# Patient Record
Sex: Female | Born: 1965 | Race: Black or African American | Hispanic: No | Marital: Single | Smoking: Never smoker
Health system: Southern US, Community
[De-identification: ages and names within clinical notes are randomized; demographics above are authoritative.]

## PROBLEM LIST (undated history)

## (undated) DIAGNOSIS — I10 Essential (primary) hypertension: Secondary | ICD-10-CM

## (undated) DIAGNOSIS — E119 Type 2 diabetes mellitus without complications: Secondary | ICD-10-CM

## (undated) HISTORY — PX: HERNIA REPAIR: SHX51

---

## 2019-01-24 ENCOUNTER — Other Ambulatory Visit: Payer: Self-pay

## 2019-01-24 ENCOUNTER — Emergency Department (HOSPITAL_BASED_OUTPATIENT_CLINIC_OR_DEPARTMENT_OTHER): Payer: BLUE CROSS/BLUE SHIELD

## 2019-01-24 ENCOUNTER — Emergency Department (HOSPITAL_BASED_OUTPATIENT_CLINIC_OR_DEPARTMENT_OTHER)
Admission: EM | Admit: 2019-01-24 | Discharge: 2019-01-25 | Disposition: A | Discharge status: Home / Self Care | Payer: BLUE CROSS/BLUE SHIELD | Attending: Emergency Medicine | Admitting: Emergency Medicine

## 2019-01-24 ENCOUNTER — Encounter (HOSPITAL_BASED_OUTPATIENT_CLINIC_OR_DEPARTMENT_OTHER): Payer: Self-pay

## 2019-01-24 DIAGNOSIS — I10 Essential (primary) hypertension: Secondary | ICD-10-CM | POA: Diagnosis not present

## 2019-01-24 DIAGNOSIS — J111 Influenza due to unidentified influenza virus with other respiratory manifestations: Secondary | ICD-10-CM | POA: Diagnosis not present

## 2019-01-24 DIAGNOSIS — E119 Type 2 diabetes mellitus without complications: Secondary | ICD-10-CM | POA: Insufficient documentation

## 2019-01-24 DIAGNOSIS — R69 Illness, unspecified: Secondary | ICD-10-CM

## 2019-01-24 DIAGNOSIS — R05 Cough: Secondary | ICD-10-CM | POA: Diagnosis present

## 2019-01-24 HISTORY — DX: Essential (primary) hypertension: I10

## 2019-01-24 HISTORY — DX: Type 2 diabetes mellitus without complications: E11.9

## 2019-01-24 NOTE — ED Provider Notes (Signed)
[facility] Provider Note: Lowella Dell, MD, FACEP  CSN: [id number] MRN: [id number] ARRIVAL: 01/24/19 at 2159 ROOM: MH03/[facility]03   CHIEF COMPLAINT  Cough   HISTORY OF PRESENT ILLNESS  01/24/19 11:55 PM Hailey Juarez is a 53 y.o. female with a 4-day history of flulike symptoms.  Specifically she has had body aches which she rates as an 8 out of 10.  She is also had a persistent cough sore throat when she coughs, diarrhea, chills and occasional nasal congestion.  She has not been vomiting.  She has been taking over-the-counter cough and cold medications without adequate relief.   Past Medical History:  Diagnosis Date  . Diabetes mellitus without complication (HCC)   . Hypertension     Past Surgical History:  Procedure Laterality Date  . CESAREAN SECTION    . HERNIA REPAIR      No family history on file.  Social History   Tobacco Use  . Smoking status: Never Smoker  . Smokeless tobacco: Never Used  Substance Use Topics  . Alcohol use: Never    Frequency: Never  . Drug use: Never    Prior to Admission medications   Not on File    Allergies Patient has no known allergies.   REVIEW OF SYSTEMS  Negative except as noted here or in the History of Present Illness.   PHYSICAL EXAMINATION  Initial Vital Signs Blood pressure (!) 147/94, pulse (!) 124, temperature 99 F (37.2 C), temperature source Oral, resp. rate 20, height 5\' 4"  (1.626 m), weight 113.9 kg, SpO2 98 %.  Examination General: Well-developed, well-nourished female in no acute distress; appearance consistent with age of record HENT: normocephalic; atraumatic; no pharyngeal erythema or exudate Eyes: pupils equal, round and reactive to light; extraocular muscles intact Neck: supple Heart: regular rate and rhythm; tachycardia Lungs: Decreased air movement bilaterally with shallow breaths Abdomen: soft; nondistended; nontender; bowel sounds present Extremities: No deformity; full range of  motion; pulses normal Neurologic: Awake, alert and oriented; motor function intact in all extremities and symmetric; no facial droop Skin: Warm and dry Psychiatric: Normal mood and affect   RESULTS  Summary of this visit's results, reviewed by myself:   EKG Interpretation  Date/Time:    Ventricular Rate:    PR Interval:    QRS Duration:   QT Interval:    QTC Calculation:   R Axis:     Text Interpretation:        Laboratory Studies: No results found for this or any previous visit (from the past 24 hour(s)). Imaging Studies: Dg Chest 2 View  Result Date: 01/24/2019 CLINICAL DATA:  Flu symptoms for 4 days.  Chest pain and cough. EXAM: CHEST - 2 VIEW COMPARISON:  None. FINDINGS: Cardiomediastinal silhouette is normal. No pleural effusions or focal consolidations. Mild bronchitic changes. Trachea deviated LEFT at the level the neck and there is no pneumothorax. Soft tissue planes and included osseous structures are non-suspicious. IMPRESSION: 1. Mild bronchitic changes without focal consolidation. 2. Tracheal deviation associated with substernal goiter. Electronically Signed   By: Awilda Metro M.D.   On: 01/24/2019 22:32    ED COURSE and MDM  Nursing notes and initial vitals signs, including pulse oximetry, reviewed.  Vitals:   01/24/19 2208  BP: (!) 147/94  Pulse: (!) 124  Resp: 20  Temp: 99 F (37.2 C)  TempSrc: Oral  SpO2: 98%  Weight: 113.9 kg  Height: 5\' 4"  (1.626 m)   Patient aware of her goiter.  We  will provide an inhaler and instruct her in its use.  She was advised to take Tylenol 650 mg every 6 hours as needed for fever or body aches.  PROCEDURES    ED DIAGNOSES     ICD-10-CM   1. Influenza-like illness R69        Reagyn Facemire, MD 01/25/19 [phone]

## 2019-01-24 NOTE — ED Triage Notes (Signed)
C/o flu like sx x 4 days-NAD-steady gait 

## 2019-01-25 MED ORDER — HYDROCOD POLST-CPM POLST ER 10-8 MG/5ML PO SUER
5.0000 mL | Freq: Once | ORAL | Status: AC
  Administered 2019-01-25: 5 mL via ORAL
  Filled 2019-01-25: qty 5

## 2019-01-25 MED ORDER — HYDROCOD POLST-CPM POLST ER 10-8 MG/5ML PO SUER: 5.0000 mL | Freq: Two times a day (BID) | ORAL | 0 refills | Status: DC | PRN

## 2019-01-25 MED ORDER — ALBUTEROL SULFATE HFA 108 (90 BASE) MCG/ACT IN AERS
2.0000 | INHALATION_SPRAY | RESPIRATORY_TRACT | Status: DC | PRN
  Administered 2019-01-25: 2 via RESPIRATORY_TRACT
  Filled 2019-01-25: qty 6.7

## 2019-01-25 MED ORDER — ACETAMINOPHEN 325 MG PO TABS
650.0000 mg | ORAL_TABLET | Freq: Once | ORAL | Status: AC
  Administered 2019-01-25: 650 mg via ORAL
  Filled 2019-01-25: qty 2

## 2020-10-17 IMAGING — DX DG CHEST 2V
2 series · 2 of 2 positions shown · non-contrast
Comparison: None.

CLINICAL DATA: Flu symptoms for 4 days.  Chest pain and cough.

EXAM:
CHEST - 2 VIEW

[chest pa]
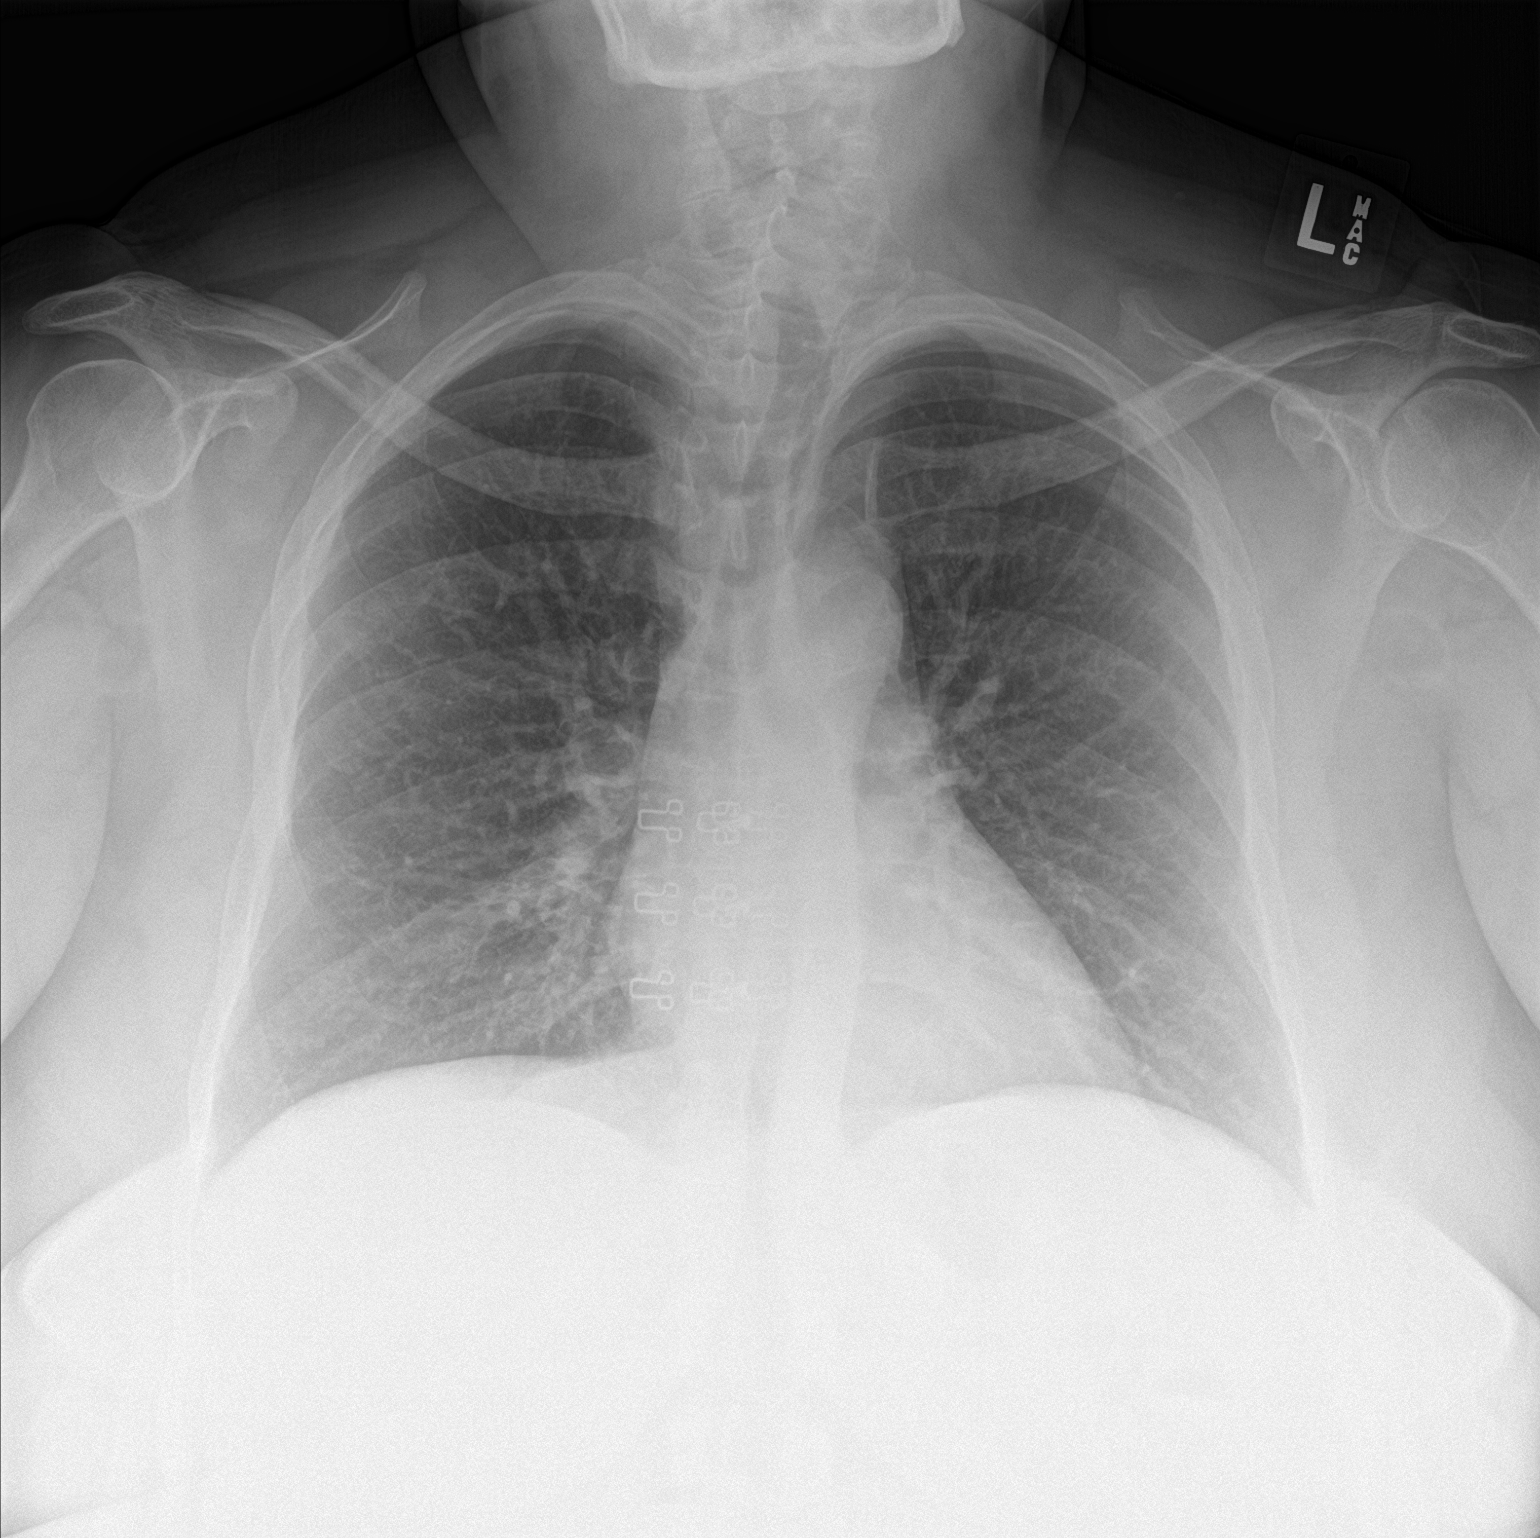

[chest lat]
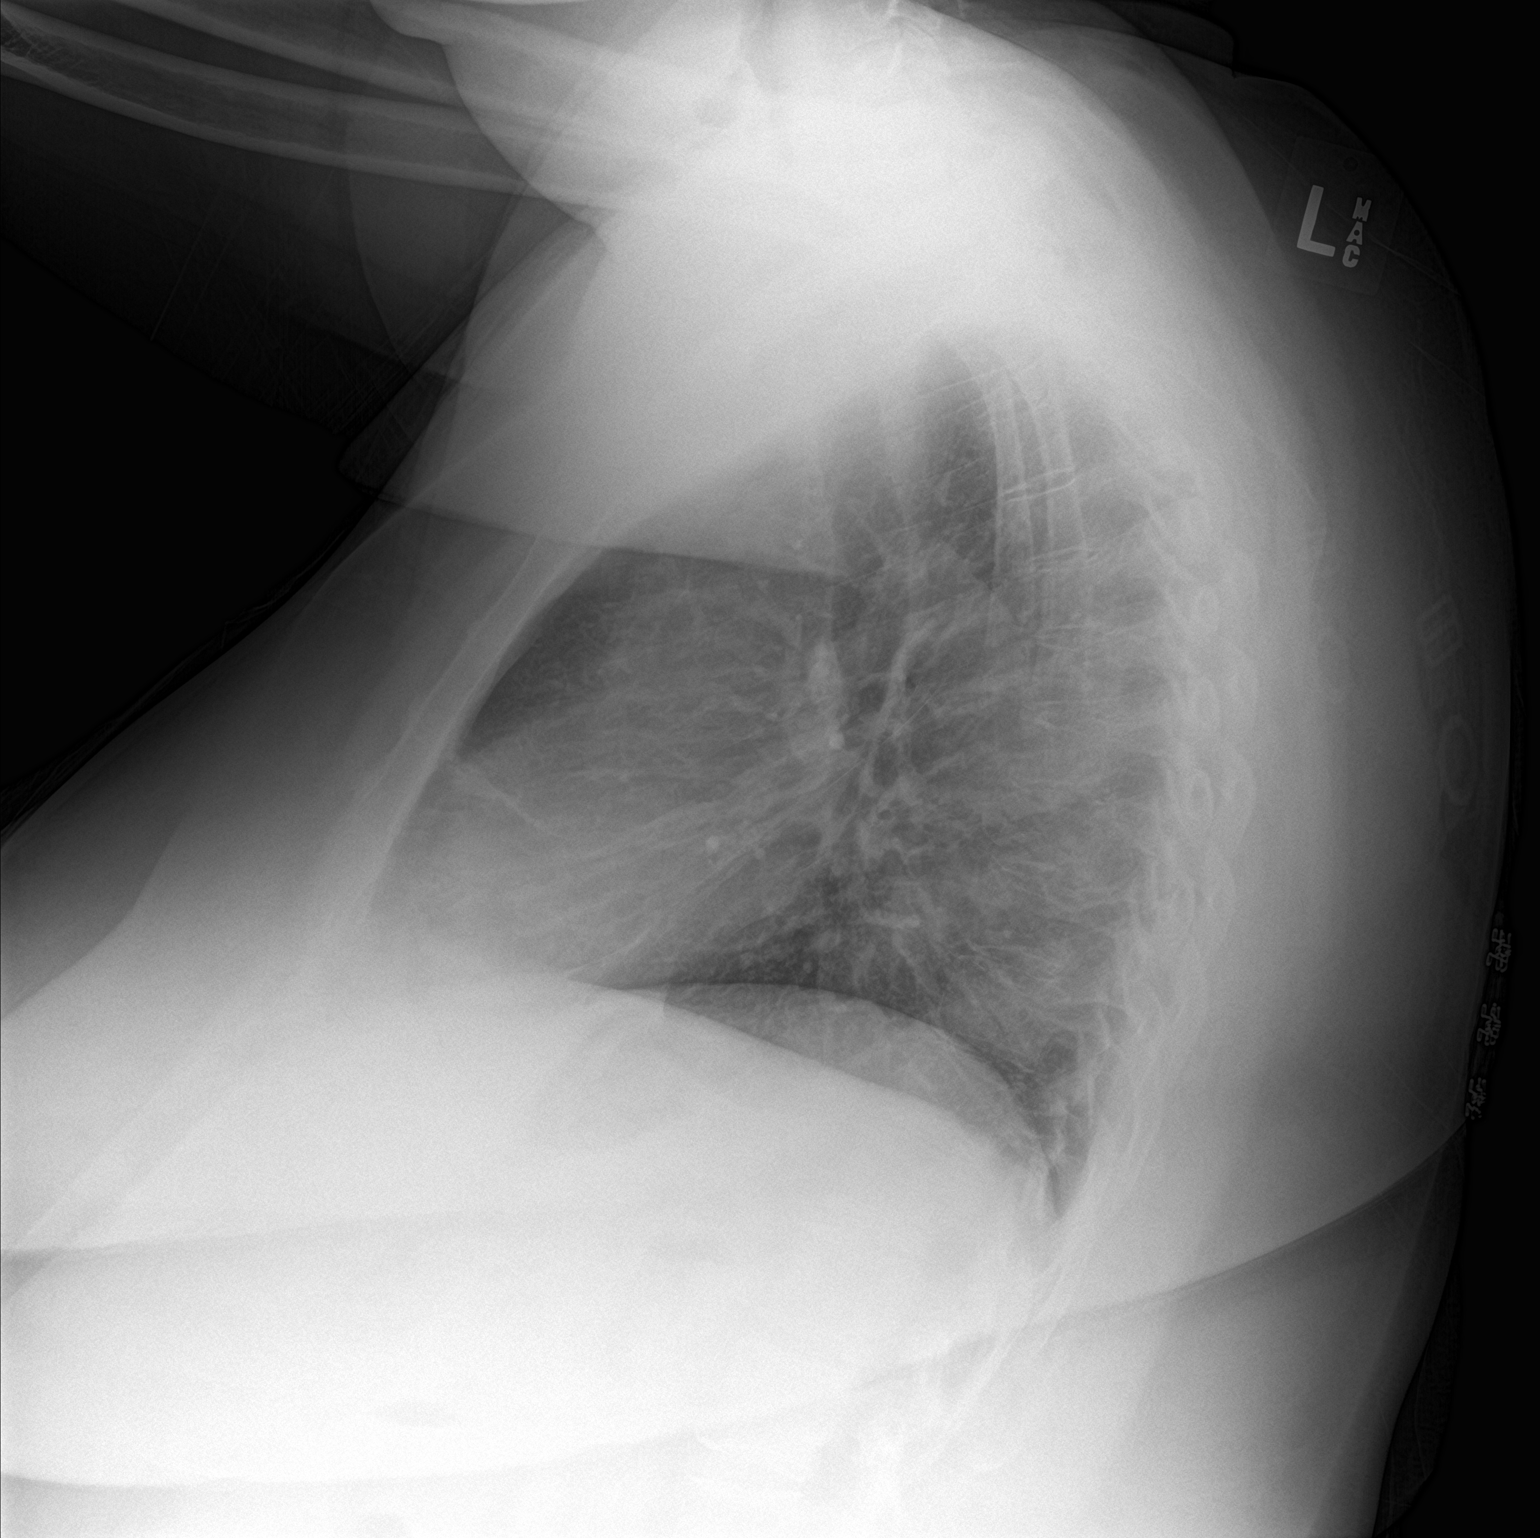

[2 of 2 positions shown; findings below may reference images not displayed]

FINDINGS: Cardiomediastinal silhouette is normal. No pleural effusions or
focal consolidations. Mild bronchitic changes. Trachea deviated LEFT
at the level the neck and there is no pneumothorax. Soft tissue
planes and included osseous structures are non-suspicious.
IMPRESSION: 1. Mild bronchitic changes without focal consolidation.
2. Tracheal deviation associated with substernal goiter.

## 2021-08-24 ENCOUNTER — Emergency Department (HOSPITAL_BASED_OUTPATIENT_CLINIC_OR_DEPARTMENT_OTHER): Payer: BLUE CROSS/BLUE SHIELD

## 2021-08-24 ENCOUNTER — Emergency Department (HOSPITAL_BASED_OUTPATIENT_CLINIC_OR_DEPARTMENT_OTHER)
Admission: EM | Admit: 2021-08-24 | Discharge: 2021-08-24 | Disposition: A | Discharge status: Home / Self Care | Payer: BLUE CROSS/BLUE SHIELD | Attending: Student | Admitting: Student

## 2021-08-24 ENCOUNTER — Other Ambulatory Visit: Payer: Self-pay

## 2021-08-24 DIAGNOSIS — I10 Essential (primary) hypertension: Secondary | ICD-10-CM | POA: Diagnosis not present

## 2021-08-24 DIAGNOSIS — J029 Acute pharyngitis, unspecified: Secondary | ICD-10-CM | POA: Diagnosis present

## 2021-08-24 DIAGNOSIS — R000 Tachycardia, unspecified: Secondary | ICD-10-CM | POA: Diagnosis not present

## 2021-08-24 DIAGNOSIS — R509 Fever, unspecified: Secondary | ICD-10-CM | POA: Diagnosis not present

## 2021-08-24 DIAGNOSIS — Z20822 Contact with and (suspected) exposure to covid-19: Secondary | ICD-10-CM | POA: Insufficient documentation

## 2021-08-24 DIAGNOSIS — E1165 Type 2 diabetes mellitus with hyperglycemia: Secondary | ICD-10-CM | POA: Insufficient documentation

## 2021-08-24 LAB — BASIC METABOLIC PANEL
Anion gap: 9 (ref 5–15)
BUN: 10 mg/dL (ref 6–20)
CO2: 27 mmol/L (ref 22–32)
Calcium: 9.7 mg/dL (ref 8.9–10.3)
Chloride: 100 mmol/L (ref 98–111)
Creatinine, Ser: 0.76 mg/dL (ref 0.44–1.00)
GFR, Estimated: >60 mL/min (ref >60)
Glucose, Bld: 204 mg/dL — ABNORMAL HIGH (ref 70–99)
Potassium: 3.4 mmol/L — ABNORMAL LOW (ref 3.5–5.1)
Sodium: 136 mmol/L (ref 135–145)

## 2021-08-24 LAB — LACTIC ACID, PLASMA: Lactic Acid, Venous: 1.5 mmol/L (ref 0.5–1.9)

## 2021-08-24 LAB — URINALYSIS, ROUTINE W REFLEX MICROSCOPIC
Bilirubin Urine: NEGATIVE
Glucose, UA: >=500 mg/dL — AB
Hgb urine dipstick: NEGATIVE
Ketones, ur: NEGATIVE mg/dL
Leukocytes,Ua: NEGATIVE
Nitrite: NEGATIVE
Protein, ur: NEGATIVE mg/dL
Specific Gravity, Urine: 1.015 (ref 1.005–1.030)
pH: 5.5 (ref 5.0–8.0)

## 2021-08-24 LAB — RESP PANEL BY RT-PCR (FLU A&B, COVID) ARPGX2
Influenza A by PCR: NEGATIVE
Influenza B by PCR: NEGATIVE
SARS Coronavirus 2 by RT PCR: NEGATIVE

## 2021-08-24 LAB — CBC WITH DIFFERENTIAL/PLATELET
Abs Immature Granulocytes: 0.08 10*3/uL — ABNORMAL HIGH (ref 0.00–0.07)
Basophils Absolute: 0 10*3/uL (ref 0.0–0.1)
Basophils Relative: 0 %
Eosinophils Absolute: 0 10*3/uL (ref 0.0–0.5)
Eosinophils Relative: 0 %
HCT: 41.7 % (ref 36.0–46.0)
Hemoglobin: 14.2 g/dL (ref 12.0–15.0)
Immature Granulocytes: 1 %
Lymphocytes Relative: 10 %
Lymphs Abs: 1.6 10*3/uL (ref 0.7–4.0)
MCH: 29.5 pg (ref 26.0–34.0)
MCHC: 34.1 g/dL (ref 30.0–36.0)
MCV: 86.7 fL (ref 80.0–100.0)
Monocytes Absolute: 1 10*3/uL (ref 0.1–1.0)
Monocytes Relative: 6 %
Neutro Abs: 13.1 10*3/uL — ABNORMAL HIGH (ref 1.7–7.7)
Neutrophils Relative %: 83 %
Platelets: 293 10*3/uL (ref 150–400)
RBC: 4.81 MIL/uL (ref 3.87–5.11)
RDW: 13.2 % (ref 11.5–15.5)
WBC: 15.8 10*3/uL — ABNORMAL HIGH (ref 4.0–10.5)
nRBC: 0 % (ref 0.0–0.2)

## 2021-08-24 LAB — URINALYSIS, MICROSCOPIC (REFLEX): RBC / HPF: NONE SEEN RBC/hpf (ref 0–5)

## 2021-08-24 LAB — HEPATIC FUNCTION PANEL
ALT: 28 U/L (ref 0–44)
AST: 22 U/L (ref 15–41)
Albumin: 3.8 g/dL (ref 3.5–5.0)
Alkaline Phosphatase: 75 U/L (ref 38–126)
Bilirubin, Direct: <0.1 mg/dL (ref 0.0–0.2)
Total Bilirubin: 0.2 mg/dL — ABNORMAL LOW (ref 0.3–1.2)
Total Protein: 7.5 g/dL (ref 6.5–8.1)

## 2021-08-24 LAB — LIPASE, BLOOD: Lipase: 50 U/L (ref 11–51)

## 2021-08-24 LAB — GROUP A STREP BY PCR: Group A Strep by PCR: NOT DETECTED

## 2021-08-24 MED ORDER — ACETAMINOPHEN 325 MG PO TABS
650.0000 mg | ORAL_TABLET | Freq: Once | ORAL | Status: AC | PRN
  Administered 2021-08-24: 650 mg via ORAL
  Filled 2021-08-24: qty 2

## 2021-08-24 MED ORDER — SODIUM CHLORIDE 0.9 % IV BOLUS
1000.0000 mL | Freq: Once | INTRAVENOUS | Status: AC
Start: 2021-08-24 — End: 2021-08-24
  Administered 2021-08-24: 1000 mL via INTRAVENOUS

## 2021-08-24 MED ORDER — IBUPROFEN 800 MG PO TABS
800.0000 mg | ORAL_TABLET | Freq: Once | ORAL | Status: AC
  Administered 2021-08-24: 800 mg via ORAL
  Filled 2021-08-24: qty 1

## 2021-08-24 MED ORDER — LIDOCAINE VISCOUS HCL 2 % MT SOLN
15.0000 mL | Freq: Once | OROMUCOSAL | Status: AC
  Administered 2021-08-24: 15 mL via OROMUCOSAL
  Filled 2021-08-24: qty 15

## 2021-08-24 NOTE — ED Triage Notes (Signed)
Pt c/o sore throat and chills since yesterday.  

## 2021-08-24 NOTE — ED Notes (Signed)
Notified Caroline PA pt's temp was 100.8 despite ibuprofen at [facility]

## 2021-08-24 NOTE — ED Provider Notes (Signed)
[facility] EMERGENCY DEPARTMENT Provider Note   CSN: [id number] Arrival date & time: 08/24/21  1517     History Chief Complaint  Patient presents with   Sore Throat    Hailey Juarez is a 55 y.o. female with a past medical history significant for diabetes and hypertension who presents to the ED due to sudden onset of sore throat and chills x2 days.  Denies difficulty swallowing, changes to phonation, and trismus.  No sick contacts or known COVID exposures.  Patient has received her COVID-19 vaccine however, has not received her booster shot.  Denies chest pain and shortness of breath.  No abdominal pain, nausea, vomiting, or diarrhea.  She has not tried anything for her symptoms.  No aggravating or alleviating factors.  History obtained from patient and past medical records. No interpreter used during encounter.       Past Medical History:  Diagnosis Date   Diabetes mellitus without complication (HCC)    Hypertension     There are no problems to display for this patient.   Past Surgical History:  Procedure Laterality Date   CESAREAN SECTION     HERNIA REPAIR       OB History   No obstetric history on file.     No family history on file.  Social History   Tobacco Use   Smoking status: Never   Smokeless tobacco: Never  Vaping Use   Vaping Use: Never used  Substance Use Topics   Alcohol use: Never   Drug use: Never    Home Medications Prior to Admission medications   Medication Sig Start Date End Date Taking? Authorizing Provider  chlorpheniramine-HYDROcodone (TUSSIONEX PENNKINETIC ER) 10-8 MG/5ML SUER Take 5 mLs by mouth every 12 (twelve) hours as needed. 01/25/19   Molpus, Jonny Ruiz, MD    Allergies    Patient has no known allergies.  Review of Systems   Review of Systems  Constitutional:  Positive for chills and fever.  HENT:  Positive for sore throat.   Respiratory:  Negative for cough and shortness of breath.   Cardiovascular:  Negative  for chest pain.  Gastrointestinal:  Negative for abdominal pain, diarrhea, nausea and vomiting.  All other systems reviewed and are negative.  Physical Exam Updated Vital Signs BP 135/86   Pulse (!) 113   Temp 100.1 F (37.8 C) (Oral)   Resp 20   Ht 5\' 4"  (1.626 m)   Wt 106.6 kg   SpO2 98%   BMI 40.34 kg/m   Physical Exam Vitals and nursing note reviewed.  Constitutional:      General: She is not in acute distress.    Appearance: She is not ill-appearing.  HENT:     Head: Normocephalic.     Mouth/Throat:     Comments: Posterior oropharynx clear and mucous membranes moist, there is mild erythema but no edema or tonsillar exudates, uvula midline, normal phonation, no trismus, tolerating secretions without difficulty. Eyes:     Pupils: Pupils are equal, round, and reactive to light.  Neck:     Comments: No meningismus. Cardiovascular:     Rate and Rhythm: Regular rhythm. Tachycardia present.     Pulses: Normal pulses.     Heart sounds: Normal heart sounds. No murmur heard.   No friction rub. No gallop.  Pulmonary:     Effort: Pulmonary effort is normal.     Breath sounds: Normal breath sounds.     Comments: Respirations equal and unlabored, patient able to speak  in full sentences, lungs clear to auscultation bilaterally Abdominal:     General: Abdomen is flat. There is no distension.     Palpations: Abdomen is soft.     Tenderness: There is no abdominal tenderness. There is no guarding or rebound.  Musculoskeletal:        General: Normal range of motion.     Cervical back: Neck supple.  Skin:    General: Skin is warm and dry.  Neurological:     General: No focal deficit present.     Mental Status: She is alert.  Psychiatric:        Mood and Affect: Mood normal.        Behavior: Behavior normal.    ED Results / Procedures / Treatments   Labs (all labs ordered are listed, but only abnormal results are displayed) Labs Reviewed  CBC WITH DIFFERENTIAL/PLATELET -  Abnormal; Notable for the following components:      Result Value   WBC 15.8 (*)    Neutro Abs 13.1 (*)    Abs Immature Granulocytes 0.08 (*)    All other components within normal limits  BASIC METABOLIC PANEL - Abnormal; Notable for the following components:   Potassium 3.4 (*)    Glucose, Bld 204 (*)    All other components within normal limits  URINALYSIS, ROUTINE W REFLEX MICROSCOPIC - Abnormal; Notable for the following components:   Glucose, UA >=500 (*)    All other components within normal limits  URINALYSIS, MICROSCOPIC (REFLEX) - Abnormal; Notable for the following components:   Bacteria, UA RARE (*)    All other components within normal limits  RESP PANEL BY RT-PCR (FLU A&B, COVID) ARPGX2  GROUP A STREP BY PCR  CULTURE, BLOOD (ROUTINE X 2)  CULTURE, BLOOD (ROUTINE X 2)  LACTIC ACID, PLASMA  LACTIC ACID, PLASMA  HEPATIC FUNCTION PANEL  LIPASE, BLOOD    EKG None  Radiology DG Chest Portable 1 View  Result Date: 08/24/2021 CLINICAL DATA:  Fever.  Sore throat and chills starting yesterday EXAM: PORTABLE CHEST 1 VIEW COMPARISON:  01/24/2019 FINDINGS: Atherosclerotic calcification of the aortic arch. Heart size within normal limits for projection. The lungs appear clear. No blunting of the costophrenic angles. IMPRESSION: 1.  Aortic Atherosclerosis (ICD10-I70.0). 2. No acute findings. Electronically Signed   By: Gaylyn Rong M.D.   On: 08/24/2021 18:39    Procedures Procedures   Medications Ordered in ED Medications  acetaminophen (TYLENOL) tablet 650 mg (650 mg Oral Given 08/24/21 1537)  lidocaine (XYLOCAINE) 2 % viscous mouth solution 15 mL (15 mLs Mouth/Throat Given 08/24/21 1717)  sodium chloride 0.9 % bolus 1,000 mL (1,000 mLs Intravenous New Bag/Given 08/24/21 1912)  ibuprofen (ADVIL) tablet 800 mg (800 mg Oral Given 08/24/21 1931)    ED Course  I have reviewed the triage vital signs and the nursing notes.  Pertinent labs & imaging results that were  available during my care of the patient were reviewed by me and considered in my medical decision making (see chart for details).  Clinical Course as of 08/24/21 1945  Mon Aug 24, 2021  1939 Lactic Acid, Venous: 1.5 [CA]    Clinical Course User Index [CA] Mannie Stabile, PA-C   MDM Rules/Calculators/A&P                          55 year old female presents to the ED due to sore throat and chills x2 days.  No sick contacts or  COVID exposures.  Patient is vaccinated against COVID-19 however, has not received her booster shot.  Upon arrival, patient febrile to 100.4 F and tachycardic at 114.  Patient nontoxic-appearing.  Patient nonseptic in appearance.  Benign physical exam.  Suspect viral etiology.  No urinary symptoms to suggest UTI.  Low suspicion for sepsis.  COVID/influenza test ordered.  Tylenol and viscous lidocaine given.  COVID/influenza negative.  Given fever and tachycardia will obtain labs, UA, and chest x-ray to rule out other sources of infection.  CBC significant for leukocytosis at 15.8.  Normal hemoglobin.  UA negative for signs of infection.  BMP significant for hyperglycemia 204.  No anion gap.  Chest x-ray personally reviewed which is negative for signs of pneumonia, pneumothorax, or widened mediastinum.  Given leukocytosis, lactic acid and blood cultures added.  Patient continues to appear nontoxic.  Patient not clinically septic at this time. No abdominal tenderness to suggest abdominal etiology.   Lactic acid normal. HR is downtrending after IVFs. Ibuprofen given for fever. Suspect tachycardia related to fever.  Patient handed off to Theophilus Kinds, PA-C at shift change pending hepatic function panel and lipase. If normal and patient's HR normalizes, she may be discharged home with the understanding she will need to return if her blood cultures are positive.   Discussed case with Dr. Posey Rea who agrees with assessment and plan.  Final Clinical Impression(s) / ED  Diagnoses Final diagnoses:  Pharyngitis, unspecified etiology    Rx / DC Orders ED Discharge Orders     None        Jesusita Oka 08/24/21 1945    Kommor, Wyn Forster, MD 08/25/21 0025

## 2021-08-24 NOTE — ED Provider Notes (Signed)
Accepted handoff at shift change from [place], [place]. Please see prior provider note for more detail.   Briefly: Patient is 56 y.o. patient came in with chief complaint of sore throat for 2 days.  She was febrile and tachycardic on arrival to the ED.  She has a leukocytosis count.  Plan at the time of transfer is to monitor patient's heart rate after giving IV fluids.  I reassessed patient and looked in her throat.  No signs of peritonsillar abscess.  Some mild erythema was noted at the back of her throat.  No neck swelling.  Patient was heart rate was around 103.  She is well-appearing appear to be in no acute distress.  I think patient's presentation is consistent with an upper respiratory infection.  She is given discharge instructions to follow-up with her PCP and return to the emergency department if she develops worsening symptoms.      RISR  EDTHIS  Physical Exam  BP 139/84   Pulse (!) 105   Temp (!) 100.8 F (38.2 C) (Oral)   Resp 17   Ht 5\' 4"  (1.626 m)   Wt 106.6 kg   SpO2 97%   BMI 40.34 kg/m   Physical Exam Vitals and nursing note reviewed.  Constitutional:      General: She is not in acute distress.    Appearance: She is not ill-appearing, toxic-appearing or diaphoretic.  HENT:     Head: Normocephalic and atraumatic.     Mouth/Throat:     Mouth: Mucous membranes are moist. No oral lesions.     Pharynx: Oropharynx is clear. Uvula midline. Posterior oropharyngeal erythema present. No oropharyngeal exudate or uvula swelling.     Tonsils: No tonsillar exudate or tonsillar abscesses.  Eyes:     Conjunctiva/sclera: Conjunctivae normal.  Musculoskeletal:     Cervical back: Normal range of motion and neck supple.  Lymphadenopathy:     Cervical: No cervical adenopathy.  Skin:    General: Skin is warm and dry.  Neurological:     Mental Status: She is alert.  Psychiatric:        Mood and Affect: Mood normal.        Behavior: Behavior normal.    ED  Course/Procedures   Clinical Course as of 08/24/21 2341  Mon Aug 24, 2021  1939 Lactic Acid, Venous: 1.5 [CA]    Clinical Course User Index [CA] Aug 26, 2021, PA-C    Procedures  MDM         Mannie Stabile 08/24/21 2344    10/24/21, MD 08/25/21 0028

## 2021-08-24 NOTE — Discharge Instructions (Addendum)
It was a pleasure taking care of you today. As discussed, your labs showed a slight increase in your white blood cells. Your urine and chest x-ray did not show any signs of infection. I still suspect your symptoms are related to viral infection. Please follow-up with your PCP within the next week for further evaluation. Your blood cultures are pending. If they are positive, you will be called back to the ER. Return to the ER for new or worsening symptoms.

## 2021-08-30 LAB — CULTURE, BLOOD (ROUTINE X 2)
Culture: NO GROWTH
Special Requests: ADEQUATE

## 2023-05-18 IMAGING — DX DG CHEST 1V PORT
1 series · 1 of 1 positions shown · non-contrast
Comparison: 01/24/2019

CLINICAL DATA: Fever.  Sore throat and chills starting yesterday

EXAM:
PORTABLE CHEST 1 VIEW

[chest ap]
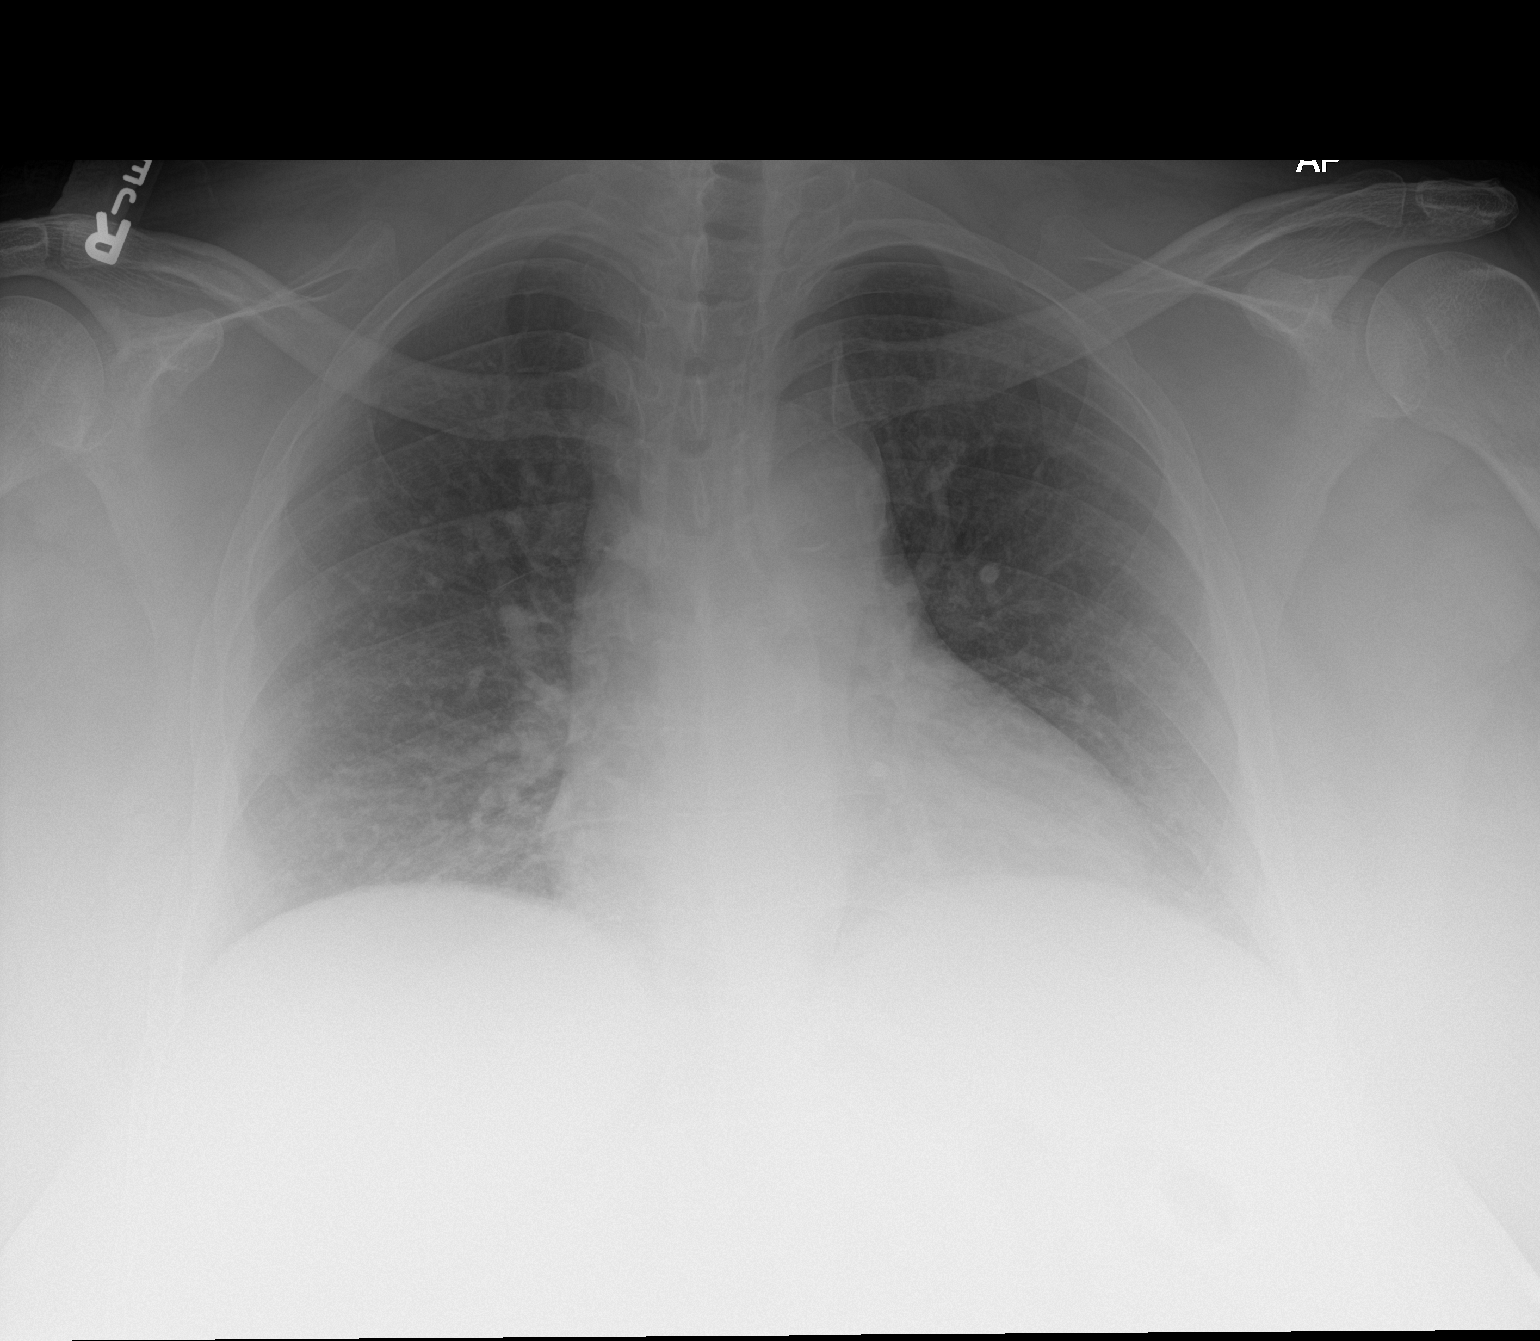

[1 of 1 positions shown; findings below may reference images not displayed]

FINDINGS: Atherosclerotic calcification of the aortic arch. Heart size within
normal limits for projection. The lungs appear clear. No blunting of
the costophrenic angles.
IMPRESSION: 1.  Aortic Atherosclerosis ([id number].T).
2. No acute findings.

## 2024-04-21 ENCOUNTER — Encounter (HOSPITAL_BASED_OUTPATIENT_CLINIC_OR_DEPARTMENT_OTHER): Payer: Self-pay

## 2024-04-21 ENCOUNTER — Emergency Department (HOSPITAL_BASED_OUTPATIENT_CLINIC_OR_DEPARTMENT_OTHER)
Admission: EM | Admit: 2024-04-21 | Discharge: 2024-04-21 | Disposition: A | Discharge status: Home / Self Care | Attending: Emergency Medicine | Admitting: Emergency Medicine

## 2024-04-21 DIAGNOSIS — Z794 Long term (current) use of insulin: Secondary | ICD-10-CM | POA: Insufficient documentation

## 2024-04-21 DIAGNOSIS — E1165 Type 2 diabetes mellitus with hyperglycemia: Secondary | ICD-10-CM | POA: Diagnosis not present

## 2024-04-21 DIAGNOSIS — R21 Rash and other nonspecific skin eruption: Secondary | ICD-10-CM | POA: Diagnosis present

## 2024-04-21 DIAGNOSIS — R739 Hyperglycemia, unspecified: Secondary | ICD-10-CM

## 2024-04-21 DIAGNOSIS — B372 Candidiasis of skin and nail: Secondary | ICD-10-CM | POA: Diagnosis not present

## 2024-04-21 LAB — CBC WITH DIFFERENTIAL/PLATELET
Abs Immature Granulocytes: 0.11 10*3/uL — ABNORMAL HIGH (ref 0.00–0.07)
Basophils Absolute: 0 10*3/uL (ref 0.0–0.1)
Basophils Relative: 1 %
Eosinophils Absolute: 0.1 10*3/uL (ref 0.0–0.5)
Eosinophils Relative: 1 %
HCT: 38.8 % (ref 36.0–46.0)
Hemoglobin: 13.2 g/dL (ref 12.0–15.0)
Immature Granulocytes: 2 %
Lymphocytes Relative: 35 %
Lymphs Abs: 2.2 10*3/uL (ref 0.7–4.0)
MCH: 30 pg (ref 26.0–34.0)
MCHC: 34 g/dL (ref 30.0–36.0)
MCV: 88.2 fL (ref 80.0–100.0)
Monocytes Absolute: 0.6 10*3/uL (ref 0.1–1.0)
Monocytes Relative: 9 %
Neutro Abs: 3.4 10*3/uL (ref 1.7–7.7)
Neutrophils Relative %: 52 %
Platelets: 257 10*3/uL (ref 150–400)
RBC: 4.4 MIL/uL (ref 3.87–5.11)
RDW: 13.1 % (ref 11.5–15.5)
WBC: 6.3 10*3/uL (ref 4.0–10.5)
nRBC: 0 % (ref 0.0–0.2)

## 2024-04-21 LAB — URINALYSIS, W/ REFLEX TO CULTURE (INFECTION SUSPECTED)
Bilirubin Urine: NEGATIVE
Glucose, UA: >=500 mg/dL — AB
Hgb urine dipstick: NEGATIVE
Ketones, ur: NEGATIVE mg/dL
Leukocytes,Ua: NEGATIVE
Nitrite: NEGATIVE
Protein, ur: NEGATIVE mg/dL
Specific Gravity, Urine: 1.01 (ref 1.005–1.030)
pH: 6 (ref 5.0–8.0)

## 2024-04-21 LAB — BASIC METABOLIC PANEL WITH GFR
Anion gap: 12 (ref 5–15)
Anion gap: 9 (ref 5–15)
BUN: 8 mg/dL (ref 6–20)
BUN: 8 mg/dL (ref 6–20)
CO2: 23 mmol/L (ref 22–32)
CO2: 24 mmol/L (ref 22–32)
Calcium: 8.9 mg/dL (ref 8.9–10.3)
Calcium: 8.5 mg/dL — ABNORMAL LOW (ref 8.9–10.3)
Chloride: 100 mmol/L (ref 98–111)
Chloride: 104 mmol/L (ref 98–111)
Creatinine, Ser: 0.71 mg/dL (ref 0.44–1.00)
Creatinine, Ser: 0.54 mg/dL (ref 0.44–1.00)
GFR, Estimated: >60 mL/min (ref >60)
GFR, Estimated: >60 mL/min (ref >60)
Glucose, Bld: 551 mg/dL (ref 70–99)
Glucose, Bld: 400 mg/dL — ABNORMAL HIGH (ref 70–99)
Potassium: 3.9 mmol/L (ref 3.5–5.1)
Sodium: 135 mmol/L (ref 135–145)
Sodium: 137 mmol/L (ref 135–145)

## 2024-04-21 LAB — CBG MONITORING, ED
Glucose-Capillary: 536 mg/dL (ref 70–99)
Glucose-Capillary: 400 mg/dL — ABNORMAL HIGH (ref 70–99)
Glucose-Capillary: 330 mg/dL — ABNORMAL HIGH (ref 70–99)

## 2024-04-21 MED ORDER — INSULIN GLARGINE-YFGN 100 UNIT/ML ~~LOC~~ SOLN: 100.0000 [IU] | Freq: Every day | SUBCUTANEOUS | 1 refills | Status: AC

## 2024-04-21 MED ORDER — SODIUM CHLORIDE 0.9 % IV BOLUS
1000.0000 mL | Freq: Once | INTRAVENOUS | Status: AC
  Administered 2024-04-21: 1000 mL via INTRAVENOUS

## 2024-04-21 MED ORDER — FLUCONAZOLE 150 MG PO TABS
150.0000 mg | ORAL_TABLET | Freq: Once | ORAL | Status: AC
  Administered 2024-04-21: 150 mg via ORAL
  Filled 2024-04-21: qty 1

## 2024-04-21 MED ORDER — NYSTATIN 100000 UNIT/GM EX CREA: 1.0000 | TOPICAL_CREAM | Freq: Three times a day (TID) | CUTANEOUS | 0 refills | Status: DC

## 2024-04-21 NOTE — ED Triage Notes (Signed)
 Rash x 3weeks under breast and groin area. Pt seen at [facility] 5/22 and dx with yeast infection and given nystatin. Per pt not working. Reports had been on abt for abdominal infection and stopped taking meds thinking they may be causing yeast

## 2024-04-21 NOTE — ED Notes (Signed)
 CBG 536. RN notified.

## 2024-04-21 NOTE — ED Notes (Signed)
CBG 330 

## 2024-04-21 NOTE — ED Provider Notes (Addendum)
 [place] EMERGENCY DEPARTMENT AT [facility] Provider Note   CSN: [id number] Arrival date & time: 04/21/24  0809     History  Chief Complaint  Patient presents with   Rash    Hailey Juarez is a 58 y.o. female.  Patient is a 58 year old female who presents with a rash.  She says been going on about 3 weeks.  She has a rash under her breast and in her groin.  She also has some vaginal itching and thinks she has a yeast infection there as well.  She was seen at [facility] on May 22 for the similar rash.  She was started on nystatin powder but says the rash has not really improved much.  It is itchy.  She is out of her all her diabetes medications.  She is trying to get into a new primary care doctor.  She says she has an insurance card but has not been able to get established with a new primary care doctor although she supposed to be seeing 1 soon.  She also reportedly was on some antibiotics for her abdomen but has not been taking these.  Sounds like on chart review that she was on medication for H. pylori.  She denies any current abdominal complaints.       Home Medications Prior to Admission medications   Medication Sig Start Date End Date Taking? Authorizing Provider  insulin glargine-yfgn (SEMGLEE, YFGN,) 100 UNIT/ML injection Inject 1 mL (100 Units total) into the skin daily. 04/21/24  Yes Hershel Los, MD  nystatin cream (MYCOSTATIN) Apply 1 Application topically 3 (three) times daily. Apply to affected area every 4-6 hours x 10 days 04/21/24  Yes Hershel Los, MD  chlorpheniramine-HYDROcodone (TUSSIONEX PENNKINETIC ER) 10-8 MG/5ML SUER Take 5 mLs by mouth every 12 (twelve) hours as needed. 01/25/19   Molpus, Autry Legions, MD      Allergies    Patient has no known allergies.    Review of Systems   Review of Systems  Constitutional:  Negative for fatigue.  Respiratory:  Negative for shortness of breath.   Cardiovascular:  Negative for chest pain.   Gastrointestinal:  Negative for abdominal pain, nausea and vomiting.  Genitourinary:  Negative for dysuria.  Skin:  Positive for rash.  Neurological:  Negative for headaches.    Physical Exam Updated Vital Signs BP (!) 165/107   Pulse 76   Temp 98.3 F (36.8 C)   Resp 16   Wt 102.1 kg   SpO2 97%   BMI 38.62 kg/m  Physical Exam Constitutional:      Appearance: She is well-developed.  HENT:     Head: Normocephalic and atraumatic.  Eyes:     Pupils: Pupils are equal, round, and reactive to light.  Cardiovascular:     Rate and Rhythm: Normal rate.  Pulmonary:     Effort: Pulmonary effort is normal. No respiratory distress.     Breath sounds: Normal breath sounds. No wheezing or rales.  Chest:     Chest wall: No tenderness.  Abdominal:     General: Bowel sounds are normal.     Palpations: Abdomen is soft.     Tenderness: There is no abdominal tenderness. There is no guarding or rebound.  Musculoskeletal:        General: Normal range of motion.     Cervical back: Normal range of motion and neck supple.  Lymphadenopathy:     Cervical: No cervical adenopathy.  Skin:    General:  Skin is warm and dry.     Findings: Rash present.     Comments: Erythema with satellite lesions under her breast and under her abdominal pannus.  There is mild erythema.  No concerns for overlying infection.  No drainage.  Neurological:     Mental Status: She is alert and oriented to person, place, and time.     ED Results / Procedures / Treatments   Labs (all labs ordered are listed, but only abnormal results are displayed) Labs Reviewed  BASIC METABOLIC PANEL WITH GFR - Abnormal; Notable for the following components:      Result Value   Glucose, Bld 551 (*)    All other components within normal limits  CBC WITH DIFFERENTIAL/PLATELET - Abnormal; Notable for the following components:   Abs Immature Granulocytes 0.11 (*)    All other components within normal limits  URINALYSIS, W/ REFLEX TO  CULTURE (INFECTION SUSPECTED) - Abnormal; Notable for the following components:   Glucose, UA >=500 (*)    Bacteria, UA RARE (*)    All other components within normal limits  BASIC METABOLIC PANEL WITH GFR - Abnormal; Notable for the following components:   Glucose, Bld 400 (*)    Calcium 8.5 (*)    All other components within normal limits  CBG MONITORING, ED - Abnormal; Notable for the following components:   Glucose-Capillary 536 (*)    All other components within normal limits  CBG MONITORING, ED - Abnormal; Notable for the following components:   Glucose-Capillary 400 (*)    All other components within normal limits  CBG MONITORING, ED - Abnormal; Notable for the following components:   Glucose-Capillary 330 (*)    All other components within normal limits    EKG None  Radiology No results found.  Procedures Procedures    Medications Ordered in ED Medications  fluconazole (DIFLUCAN) tablet 150 mg (150 mg Oral Given 04/21/24 0842)  sodium chloride  0.9 % bolus 1,000 mL (0 mLs Intravenous Stopped 04/21/24 0957)  sodium chloride  0.9 % bolus 1,000 mL (0 mLs Intravenous Stopped 04/21/24 1109)    ED Course/ Medical Decision Making/ A&P                                 Medical Decision Making Problems Addressed: Candidal intertrigo: acute illness or injury Hyperglycemia: acute illness or injury  Amount and/or Complexity of Data Reviewed External Data Reviewed: notes. Labs: ordered. Decision-making details documented in ED Course.  Risk Prescription drug management. Decision regarding hospitalization.   Patient is a 58 year old female who presents with a rash.  It looks to be consistent with a candidal infection.  She is on nystatin powder but says it is not going away.  She does not appear to be systemically ill.  She is a diabetic and apparently she has been out of her injectable insulin for a while.  She still has her metformin and glyburide as well as her other  medications.  Her glucose was checked and was over 500.  Given this, an IV was established and she was given IV fluids.  Labs were evaluated.  She does have hyperglycemia but no suggestions of DKA.  She was given 2 L of IV fluids and her glucose came down in the 300 range.  Suspect this may be contributing to her candidal infection.  Called [facility] where she gets her medications and she is out of her Semglee.  Will represcribe  this.  Was also given a prescription for nystatin cream.  Will consult TOC to see if we can get her PCP appointment.  She was discharged home in good condition.  Return precautions were given.  Final Clinical Impression(s) / ED Diagnoses Final diagnoses:  Candidal intertrigo  Hyperglycemia    Rx / DC Orders ED Discharge Orders          Ordered    nystatin cream (MYCOSTATIN)  3 times daily        04/21/24 1239    insulin glargine-yfgn (SEMGLEE, YFGN,) 100 UNIT/ML injection  Daily        04/21/24 1247              Hershel Los, MD 04/21/24 1249    Hershel Los, MD 04/21/24 1250

## 2024-06-26 NOTE — ED Provider Notes (Signed)
 Patient placed in First Look pathway, seen and evaluated for chief complaint of vaginal itching that started last week, also has bilateral foot and toe pain for the same amount of time.  Pertinent exam findings include non-toxic in appearance, speech is clear, and ambulates with a steady gait. Based on initial evaluation, labs are currently indicated and radiology studies are not currently indicated as allowed for current processes and treatments as applicable in a triage setting and could be different than if patient were seen in a main treatment area or dependent on labs/imagining after results are displayed.  Patient counseled on process, plan, and necessity for staying for completing the evaluation.   This document serves as a record of services personally performed by Maranda Grate PA-C.   Emergency Department Provider Note  Dragon voice dictation used for charting.    Provider at bedside: 4:29 PM  History obtained from the: Patient  History   Chief Complaint  Patient presents with  . Vaginal Problem     HPI  Hailey Juarez is a 58 y.o. female with a PMH of hypertension and DM2 who presents to the ED with multiple complaints.  Last week, the patient developed vaginal itching and abnormal vaginal discharge.  She also states it stays wet down there and she has been placing toilet paper between the vulva.  She denies any pain with urination, burning with urination, or polyuria.  Patient also endorses around a week of bilateral foot pain.  She describes the pain as tingling.  She denies any recent injuries or fevers. The patient states that her blood sugar does tend to remain elevated, somewhere between 200-300.  She states she is compliant with her medications, but does not always eat a diabetic friendly diet.    No LMP recorded.   Past Medical History Medical History[1]  Past Surgical History Surgical History[2]    Allergies Allergies[3]   Family  History Family History[4]   Social History Social History[5]    Physical Exam   Vitals:   06/26/24 1510 06/26/24 1831  BP: (!) 146/91 138/83  BP Location: Right arm   Patient Position: Sitting   Pulse: 97 88  Resp: 18 16  Temp: 98.2 F (36.8 C)   TempSrc: Oral   SpO2: 98% 97%  Weight: 100 kg (221 lb)   Height: 162.6 cm (5' 4)     Physical Exam Vitals and nursing note reviewed. Exam conducted with a chaperone present BANKER Leita).  Constitutional:      General: She is not in acute distress.    Appearance: She is well-developed. She is not toxic-appearing.  HENT:     Head: Normocephalic and atraumatic.     Right Ear: External ear normal.     Left Ear: External ear normal.   Eyes:     General: Lids are normal.     Conjunctiva/sclera: Conjunctivae normal.    Cardiovascular:     Rate and Rhythm: Normal rate and regular rhythm.     Pulses: Normal pulses.     Heart sounds: Normal heart sounds.  Pulmonary:     Effort: Pulmonary effort is normal.     Breath sounds: Normal breath sounds.  Genitourinary:    Comments: Thick, whitish vaginal discharge present.  There is vulvar edema and erythema as well.  No visible abscesses or purulent drainage  Musculoskeletal:     Comments: Bilateral feet: No obvious bruising, deformity, or swelling.  Patient is diffusely tender to palpation throughout the feet and into  the lower legs.  Limbs are probably warm to touch, but there is no excessive warmth to the touch or erythema.  Pedal pulses 2+.  No visible wounds to the feet or between the toes.  Toenails are painted, but do not appear abnormally thick   Neurological:     Mental Status: She is alert.   Psychiatric:        Mood and Affect: Mood normal.        Behavior: Behavior normal.     Labs   Lab Results (last 24 hours)     Procedure Component Value Ref Range Date/Time   POC HCG Qual, Urine [[id number]]  (Normal) Collected: 06/26/24 1720   Lab Status: Final result  Specimen: Urine from Clean Catch Updated: 06/26/24 1721    HCG, Urine, POC Negative Negative     Internal Control Acceptable    Kit/Device Lot # [id number]    Kit/Device Expiration Date 07/22/2025   Urinalysis with Reflex to Microscopic [[id number]]  (Abnormal) Collected: 06/26/24 1719   Lab Status: Final result Specimen: Urine from Clean Catch Updated: 06/26/24 1757    Color, Urine Yellow Yellow     Clarity, Urine Clear Clear     Specific Gravity, Urine 1.042* 1.005 - 1.025     pH, Urine 5.0 5.0 - 8.0     Protein, Urine Negative Negative, 10 , 20  mg/dL     Glucose, Urine >8999* Negative, 30 , 50  mg/dL     Ketones, Urine Trace Negative, Trace mg/dL     Bilirubin, Urine Negative Negative     Blood, Urine Negative Negative, Trace     Nitrite, Urine Negative Negative     Leukocyte Esterase, Urine 75* Negative, 25     Urobilinogen, Urine Normal <2.0 mg/dL     WBC, Urine 3-87* <6 /HPF     RBC, Urine 11-20* 0 - 2 /HPF     Bacteria, Urine None Seen None Seen, Rare /HPF     Squamous Epithelial Cells, Urine 6-10* 0 - 5 /HPF     Budding Yeast, Urine Present* None Seen /HPF    CBC with Differential [[id number]] Collected: 06/26/24 1657   Lab Status: Final result Specimen: Blood from Venous Updated: 06/26/24 1716   Narrative:     The following orders were created for panel order CBC with Differential. Procedure                               Abnormality         Status                    ---------                               -----------         ------                    CBC with [id number]]                           Final result               Please view results for these tests on the individual orders.   Comprehensive Metabolic Panel [[id number]]  (Abnormal) Collected: 06/26/24 1657   Lab Status: Final result Specimen: Blood from Venous Updated: 06/26/24 1753  Sodium 136 136 - 145 mmol/L     Potassium 3.6 3.4 - 4.5 mmol/L     Chloride 98 98 - 107 mmol/L     CO2 30 21 - 31 mmol/L      Anion Gap 8 6 - 14 mmol/L     Glucose, Random 346* 70 - 99 mg/dL     Blood Urea Nitrogen (BUN) 13 7 - 25 mg/dL     Creatinine 9.37 9.39 - 1.20 mg/dL     eGFR >09 >40 fO/fpw/8.26f7     Comment: GFR estimated by CKD-EPI equations(NKF 2021).   Recommend confirmation of Cr-based eGFR by using Cys-based eGFR and other filtration markers (if applicable) in complex cases and clinical decision-making, as needed.      Albumin 4.0 3.5 - 5.7 g/dL     Total Protein 7.6 6.4 - 8.9 g/dL     Bilirubin, Total 0.5 0.3 - 1.0 mg/dL     Alkaline Phosphatase (ALP) 85 34 - 104 U/L     Aspartate Aminotransferase (AST) 14 13 - 39 U/L     Alanine Aminotransferase (ALT) 20 7 - 52 U/L     Calcium 9.6 8.6 - 10.3 mg/dL     BUN/Creatinine Ratio --    Comment: Creatinine is normal, ratio is not clinically indicated.      Hemoglobin A1C With Estimated Average Glucose [[id number]]  (Abnormal) Collected: 06/26/24 1657   Lab Status: Final result Specimen: Blood from Venous Updated: 06/26/24 1731    Hemoglobin A1c 13.6* <5.7 %     Comment: Normal A1c:             Less than 5.7%  Prediabetes range A1c:  5.7% to 6.4%  Diabetes range A1c:     Greater than 6.4%      Estimated Average Glucose 344* 70 - 154 mg/dL     Comment: The ADA has supported the calculation of Average Glucose (eAG) based on HbA1c measurements. However, the eAG reflects the average glycemic level over 2-3 months and it would not necessarily match single glucose laboratory measurements, nor reflect changes in the daily glucose concentration.     CBC with Differential [[id number]] Collected: 06/26/24 1657   Lab Status: Final result Specimen: Blood from Venous Updated: 06/26/24 1716    WBC 7.90 4.40 - 11.00 10*3/uL     RBC 4.71 4.10 - 5.10 10*6/uL     Hemoglobin 14.3 12.3 - 15.3 g/dL     Hematocrit 58.3 64.0 - 44.6 %     Mean Corpuscular Volume (MCV) 88.3 80.0 - 96.0 fL     Mean Corpuscular Hemoglobin (MCH) 30.3 27.5 - 33.2 pg     Mean  Corpuscular Hemoglobin Conc (MCHC) 34.3 33.0 - 37.0 g/dL     Red Cell Distribution Width (RDW) 13.8 12.3 - 17.0 %     Platelet Count (PLT) 275 150 - 450 10*3/uL     Mean Platelet Volume (MPV) 7.7 6.8 - 10.2 fL     Neutrophils % 53 %     Lymphocytes % 38 %     Monocytes % 7 %     Eosinophils % 1 %     Basophils % 1 %     Neutrophils Absolute 4.20 1.80 - 7.80 10*3/uL     Lymphocytes # 3.00 1.00 - 4.80 10*3/uL     Monocytes # 0.60 0.00 - 0.80 10*3/uL     Eosinophils # 0.00 0.00 - 0.50 10*3/uL     Basophils # 0.10 0.00 -  0.20 10*3/uL    Chlamydia / Gonococcus [facility]), NAAT [[id number]]  (Normal) Collected: 06/26/24 1559   Lab Status: Final result Specimen: Swab from Endocervical/Cervical Updated: 06/26/24 1733    Chlamydia (CT) Not Detected Not Detected     Gonorrhea (GC) Not Detected Not Detected    Narrative:     The Xpert CT/NG Assay is an automated in vitro diagnostic test for qualitative detection and differentiation of DNA from CT and NG. The assay is performed on the [employer]. The Gene[employer]pert Instrument Systems automate and integrate sample purification, nucleic acid amplification, and detection of the target sequences in simple or complex samples using real-time PCR and RT-PCR assays.  Xpert CT/NG Assay performance has not been evaluated in patients less than fourteen years of age, in pregnant women, or in patients with a history of hysterectomy.   Wet Prep [[id number]]  (Normal) Collected: 06/26/24 1559   Lab Status: Final result Specimen: Swab from Vagina Updated: 06/26/24 1608    WBC, Wet Prep Rare Negative, Rare     Clue Cells, Wet Prep Negative Negative     Trichomonas, Wet Prep Negative Negative     Yeast, Wet Prep Negative Negative          Radiology   Radiology Results (last 72 hours)     ** No results found for the last 72 hours. **        EKG     No results found for this visit on 06/26/24.   ED Course   ED Course as of 06/26/24  1935  Tue Jun 26, 2024  1648 Urinalysis ordered to evaluate for infection, hematuria, proteinuria, dehydration, glucosuria CBC, MP evaluated for leukocytosis, leukopenia, anemia, thrombocytopenia, hyponatremia, hypernatremia, hypokalemia, acidosis, acute kidney injury, hyperglycemia, hypoglycemia.  [AS]    ED Course User Index [AS] Allyson J Schlagheck, PA-C    Procedure Note   Procedures  Medical Decision Making   Clinical Complexity  Patient's presentation is most consistent with acute illness / injury with systematic symptoms.    Provider time spent in patient care today, inclusive of but not limited to clinical reassessment, review of diagnostic studies, and discharge preparation, was greater than 30 minutes.    Medical Decision Making Problems Addressed: Foot pain, bilateral: complicated acute illness or injury Hyperglycemia: chronic illness or injury Vulvar candidiasis: complicated acute illness or injury  Amount and/or Complexity of Data Reviewed Labs: ordered. Decision-making details documented in ED Course.  Risk OTC drugs. Prescription drug management.     Differential diagnosis includes but is not limited to DDX: STD, urinary tract infection, DDX: not enough insulin , UTI, cellulitis, alcohol, drugs, glucocorticoids, thiazides, DKA, HHS   ED Clinical Impression   1. Vulvar candidiasis   2. Foot pain, bilateral   3. Hyperglycemia      ED Assessment/Plan  Patient presented to the ED with multiple complaints.  Upon arrival to patient, she was resting on the hospital bed in no acute distress.  Physical exam was remarkable for the above findings.  CBC was unremarkable.  CMP was markable for hyperglycemia of 346.  A1c returned at 13.6 correlating to an estimated average glucose of 344.  UA was not consistent with a UTI, but did show glycosuria and elevated specific gravity as well as budding yeast.  Urine pregnancy test was negative.  Gonorrhea, committee a,  BV, Cheponis, and and Chlamydia tests were negative.  Patient's wet prep did not show any significant findings, however given the appearance of the vulvar tissue  on visual exam, I am very concerned that the patient has a yeast infection and she will be treated.  I did have a long discussion with the patient concerning her hyperglycemia, however given that she does not show signs of DKA or HHS, I feel she be safely discharged.  She does note that she has an upcoming appoint with her endocrinologist.  I encouraged her to follow a diabetic friendly diet and keep a log of her blood sugar measurements. Other return precautions were discussed and patient was given the rest of her discharge instructions.  She stated that she understood and had no further questions.  She was discharged in stable condition.  ED Meds Given During Visit Medications - No data to display    Discharge Medication List as of 06/26/2024  6:20 PM        FOLLOW UP [facility] -  EMERGENCY DEPARTMENT [address] [place]  [address] [phone]  As needed   Electronically signed by: 4:29 PM 06/26/2024 for Allyson Schlagheck PA-C        [1] Past Medical History: Diagnosis Date  . 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 Barrett's esophagus without dysplasia 01/23/2019  . 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 Cervical radiculopathy 06/10/2021  . Chest pain at rest 01/22/2016  . Colon polyp   . Diabetes mellitus    (CMD)   . Essential hypertension 01/22/2016  . History of Helicobacter pylori infection 01/23/2019  . Hyperplastic polyp of ascending colon 01/23/2019  . Hypertension   . Menopausal symptoms 12/25/2017  . Meralgia paresthetica of left side 12/23/2017  . Microalbuminuria due to type 2 diabetes mellitus    (CMD) 11/14/2016  . Mixed hyperlipidemia 01/23/2019  . Morbid obesity with BMI of 40.0-44.9, adult (CMD) 08/03/2016   . Nontraumatic complete tear of left rotator cuff 06/10/2021  . OSA (obstructive sleep apnea) 08/31/2019   NO CPAP, COULD NOT AFFORD PER PT REPORT  . Thyroid nodule 01/23/2019  . Tubular adenoma 01/23/2019  . Type 2 diabetes mellitus with hyperglycemia, with long-term current use of insulin     (CMD) 01/22/2016  [2] Past Surgical History: Procedure Laterality Date  . CESAREAN SECTION, UNSPECIFIED     Procedure: CESAREAN SECTION  . COLONOSCOPY     Procedure: COLONOSCOPY  . ESOPHAGOGASTRODUODENOSCOPY N/A 05/13/2020   Procedure: EGD;  Surgeon: Teena DELENA Mcgregor, MD;  Location: [facility] ENDO OR;  Service: Gastroenterology;  Laterality: N/A;  . FLEXIBLE SIGMOIDOSCOPY N/A 05/13/2020   Procedure: FLEXIBLE SIGMOIDOSCOPY;  Surgeon: Teena DELENA Mcgregor, MD;  Location: [facility] ENDO OR;  Service: Gastroenterology;  Laterality: N/A;  . HERNIA REPAIR     Procedure: HERNIA REPAIR  . TONSILLECTOMY     Procedure: TONSILLECTOMY  . TUBAL LIGATION     Procedure: TUBAL LIGATION  [3] No Known Allergies [4] Family History Problem Relation Name Age of Onset  . Cancer Father         lung  . Hypertension Mother    . Breast cancer Neg Hx    [5] Social History Tobacco Use  . Smoking status: Never  . Smokeless tobacco: Never  Substance Use Topics  . Alcohol use: No  . Drug use: No  *Some images could not be shown.

## 2024-09-25 NOTE — Telephone Encounter (Signed)
 Spoke with pt at 1400, verified by name and DOB. She verbalized understanding of results below and agrees to take medications.    Tobias Almarie Domino, NP CP   09/21/24  4:25 PM Result Comment Vaginal swab showed Candida glabrata and Trichomonos.  I sent in a prescription for  trichomonas called Flagyl, 1 tablet 2 times a day for 7 days.  Please make sure that you take all of the antibiotic. Please take with food to minimize stomach upset. Do not drink any alcohol while taking Flagyl and for 3 days afterwards. Please abstain from sexual intercourse until you and your partner have been treated and are asymptomatic of symp toms.  Also, I sent in a prescription for vaginal boric acid is a 600 mg capsule inserted into the vagina every night for the next 21 nights.  Thank you, Tobias

## 2024-09-25 NOTE — Telephone Encounter (Signed)
 Patient called back to see how many prescriptions were sent

## 2024-10-12 NOTE — Telephone Encounter (Signed)
 Patient reporting medications are not helping. She has been taking the Flagyl and she did get the boric acid suppositories but still having severe itching, pain and discharge. Denied any vaginal bleeding. Asking what else she can take? I did advise provider might not see message until Monday and if clinical staff does not call her back then recommend going to UC of choice

## 2024-10-12 NOTE — Telephone Encounter (Signed)
 Patient called in regarding this matter, states she has swelling Vaginal discharge,itching and extremely sore.  Patient stated medication is not working and would like a call back as soon  as possible.   Call back [phone]  metroNIDAZOLE (FLAGYL) 500 mg tablet

## 2024-10-13 ENCOUNTER — Emergency Department (HOSPITAL_BASED_OUTPATIENT_CLINIC_OR_DEPARTMENT_OTHER)
Admission: EM | Admit: 2024-10-13 | Discharge: 2024-10-13 | Disposition: A | Discharge status: Home / Self Care | Attending: Emergency Medicine | Admitting: Emergency Medicine

## 2024-10-13 ENCOUNTER — Other Ambulatory Visit: Payer: Self-pay

## 2024-10-13 ENCOUNTER — Encounter (HOSPITAL_BASED_OUTPATIENT_CLINIC_OR_DEPARTMENT_OTHER): Payer: Self-pay | Admitting: *Deleted

## 2024-10-13 DIAGNOSIS — E1165 Type 2 diabetes mellitus with hyperglycemia: Secondary | ICD-10-CM | POA: Diagnosis not present

## 2024-10-13 DIAGNOSIS — R739 Hyperglycemia, unspecified: Secondary | ICD-10-CM

## 2024-10-13 DIAGNOSIS — B3731 Acute candidiasis of vulva and vagina: Secondary | ICD-10-CM | POA: Diagnosis not present

## 2024-10-13 DIAGNOSIS — Z794 Long term (current) use of insulin: Secondary | ICD-10-CM | POA: Diagnosis not present

## 2024-10-13 DIAGNOSIS — L292 Pruritus vulvae: Secondary | ICD-10-CM | POA: Diagnosis present

## 2024-10-13 LAB — CBC WITH DIFFERENTIAL/PLATELET
Abs Immature Granulocytes: 0.02 K/uL (ref 0.00–0.07)
Basophils Absolute: 0 K/uL (ref 0.0–0.1)
Basophils Relative: 1 %
Eosinophils Absolute: 0 K/uL (ref 0.0–0.5)
Eosinophils Relative: 1 %
HCT: 38.9 % (ref 36.0–46.0)
Hemoglobin: 13.4 g/dL (ref 12.0–15.0)
Immature Granulocytes: 0 %
Lymphocytes Relative: 33 %
Lymphs Abs: 2.6 K/uL (ref 0.7–4.0)
MCH: 30 pg (ref 26.0–34.0)
MCHC: 34.4 g/dL (ref 30.0–36.0)
MCV: 87 fL (ref 80.0–100.0)
Monocytes Absolute: 0.6 K/uL (ref 0.1–1.0)
Monocytes Relative: 7 %
Neutro Abs: 4.5 K/uL (ref 1.7–7.7)
Neutrophils Relative %: 58 %
Platelets: 270 K/uL (ref 150–400)
RBC: 4.47 MIL/uL (ref 3.87–5.11)
RDW: 12.5 % (ref 11.5–15.5)
WBC: 7.7 K/uL (ref 4.0–10.5)
nRBC: 0 % (ref 0.0–0.2)

## 2024-10-13 LAB — URINALYSIS, ROUTINE W REFLEX MICROSCOPIC
Bilirubin Urine: NEGATIVE
Glucose, UA: >=500 mg/dL — AB
Ketones, ur: NEGATIVE mg/dL
Nitrite: NEGATIVE
Protein, ur: NEGATIVE mg/dL
Specific Gravity, Urine: 1.015 (ref 1.005–1.030)
pH: 6 (ref 5.0–8.0)

## 2024-10-13 LAB — URINALYSIS, MICROSCOPIC (REFLEX)

## 2024-10-13 LAB — WET PREP, GENITAL
Clue Cells Wet Prep HPF POC: NONE SEEN
Sperm: NONE SEEN
Trich, Wet Prep: NONE SEEN
WBC, Wet Prep HPF POC: <10 (ref <10)

## 2024-10-13 LAB — BASIC METABOLIC PANEL WITH GFR
Anion gap: 10 (ref 5–15)
BUN: 6 mg/dL (ref 6–20)
CO2: 26 mmol/L (ref 22–32)
Calcium: 9.2 mg/dL (ref 8.9–10.3)
Chloride: 103 mmol/L (ref 98–111)
Creatinine, Ser: 0.52 mg/dL (ref 0.44–1.00)
GFR, Estimated: >60 mL/min (ref >60)
Glucose, Bld: 337 mg/dL — ABNORMAL HIGH (ref 70–99)
Potassium: 4.2 mmol/L (ref 3.5–5.1)
Sodium: 138 mmol/L (ref 135–145)

## 2024-10-13 LAB — CBG MONITORING, ED: Glucose-Capillary: 340 mg/dL — ABNORMAL HIGH (ref 70–99)

## 2024-10-13 LAB — HIV ANTIBODY (ROUTINE TESTING W REFLEX): HIV Screen 4th Generation wRfx: NONREACTIVE

## 2024-10-13 MED ORDER — FLUCONAZOLE 150 MG PO TABS: 150.0000 mg | ORAL_TABLET | Freq: Once | ORAL | 0 refills | Status: AC

## 2024-10-13 MED ORDER — FLUCONAZOLE 150 MG PO TABS
150.0000 mg | ORAL_TABLET | Freq: Once | ORAL | Status: AC
  Administered 2024-10-13: 150 mg via ORAL
  Filled 2024-10-13: qty 1

## 2024-10-13 MED ORDER — MICONAZOLE NITRATE 2 % VA CREA: 1.0000 | TOPICAL_CREAM | Freq: Every day | VAGINAL | 0 refills | Status: AC

## 2024-10-13 NOTE — ED Triage Notes (Signed)
 Pt presents with concerns of vaginal itching, discharge, and rash. Pt describes small macular clusters in the rt groin and on the labia. Pt states discharge from rash. Vaginal discharge is light yellow. Onset: 2 weeks.  PCP 10/30 Prescribed antibiotic  and stopped taking it. (4 pills left).  Endorse soreness, odor in vaginal area. Denies abd pain, N/V/D.

## 2024-10-13 NOTE — ED Provider Notes (Signed)
 [facility]  Provider Note  CSN: [id number] Arrival date & time: 10/13/24 9495  History Chief Complaint  Patient presents with   Vaginal Itching    With rash and discharge    Hailey Juarez is a 58 y.o. female with poorly controlled DM and recurrent candidal vulvovaginitis was seen by PCP about 3 weeks ago for same. Swabs then positive for yeast and trichomonas. She was given Rx for diflucan , boric acid suppositories and flagyl but she did not complete the flagyl due to lack of improvement. Glucose has remained elevated.    Home Medications Prior to Admission medications   Medication Sig Start Date End Date Taking? Authorizing Provider  fluconazole  (DIFLUCAN ) 150 MG tablet Take 1 tablet (150 mg total) by mouth once for 1 dose. 10/16/24 10/16/24 Yes Roselyn Carlin NOVAK, MD  miconazole  (MICONAZOLE  7) 2 % vaginal cream Place 1 Applicatorful vaginally at bedtime for 7 days. 10/13/24 10/20/24 Yes Roselyn Carlin NOVAK, MD  insulin  glargine-yfgn (SEMGLEE , YFGN,) 100 UNIT/ML injection Inject 1 mL (100 Units total) into the skin daily. 04/21/24   Lenor Hollering, MD     Allergies    Patient has no known allergies.   Review of Systems   Review of Systems Please see HPI for pertinent positives and negatives  Physical Exam BP (!) 145/84 (BP Location: Right Arm)   Pulse 84   Temp 98.7 F (37.1 C) (Oral)   Resp 17   Ht 5' 4 (1.626 m)   Wt 99.8 kg   SpO2 97%   BMI 37.76 kg/m   Physical Exam Vitals and nursing note reviewed. Exam conducted with a chaperone present.  Constitutional:      Appearance: Normal appearance.  HENT:     Head: Normocephalic and atraumatic.     Nose: Nose normal.     Mouth/Throat:     Mouth: Mucous membranes are moist.  Eyes:     Extraocular Movements: Extraocular movements intact.     Conjunctiva/sclera: Conjunctivae normal.  Cardiovascular:     Rate and Rhythm: Normal rate.  Pulmonary:     Effort:  Pulmonary effort is normal.     Breath sounds: Normal breath sounds.  Abdominal:     General: Abdomen is flat.     Palpations: Abdomen is soft.     Tenderness: There is no abdominal tenderness.  Genitourinary:    Comments: Marked vulvar erythema and induration with intertriginous yeast; mild vaginal discharge, did not tolerate speculum Musculoskeletal:        General: No swelling. Normal range of motion.     Cervical back: Neck supple.  Skin:    General: Skin is warm and dry.  Neurological:     General: No focal deficit present.     Mental Status: She is alert.  Psychiatric:        Mood and Affect: Mood normal.     ED Results / Procedures / Treatments   EKG None  Procedures Procedures  Medications Ordered in the ED Medications  fluconazole  (DIFLUCAN ) tablet 150 mg (has no administration in time range)    Initial Impression and Plan  Patient here with persistent vaginal yeast infection, also had recent positive trichomonas test. No sexual activity in 2 months. Will check labs due to elevated glucose, vaginal swabs collected.   ED Course   Clinical Course as of 10/13/24 0644  Sat Oct 13, 2024  0552 UA with some signs of infection vs contaminate from her vaginal infection.  Will send for culture.  [CS]  0555 Wet prep confirms yeast. Trichomonas has resolved.  [CS]  0609 CBC is normal.  [CS]  0634 BMP with increased glucose, but no signs of DKA.  [CS]  9361 Discussed with patient that her hyperglycemia will set her up for recurrent and difficult to control yeast infections. Strongly encouraged to get her glucose under control through diet and medication compliance. In the meantime, will treat her yeast infection with diflucan  x 2 and topical azole cream. Recommend outpatient Gyn follow up for long term management.  [CS]    Clinical Course User Index [CS] Roselyn Carlin NOVAK, MD     MDM Rules/Calculators/A&P Medical Decision Making Problems Addressed: Hyperglycemia:  chronic illness or injury with exacerbation, progression, or side effects of treatment Vulvovaginal candidiasis: chronic illness or injury with exacerbation, progression, or side effects of treatment  Amount and/or Complexity of Data Reviewed Labs: ordered. Decision-making details documented in ED Course.  Risk OTC drugs. Prescription drug management.     Final Clinical Impression(s) / ED Diagnoses Final diagnoses:  Vulvovaginal candidiasis  Hyperglycemia    Rx / DC Orders ED Discharge Orders          Ordered    fluconazole  (DIFLUCAN ) 150 MG tablet   Once        10/13/24 0643    miconazole  (MICONAZOLE  7) 2 % vaginal cream  Daily at bedtime        10/13/24 9356             Roselyn Carlin NOVAK, MD 10/13/24 [phone]

## 2024-10-14 LAB — URINE CULTURE: Culture: <10000 — AB

## 2024-10-14 LAB — SYPHILIS: RPR W/REFLEX TO RPR TITER AND TREPONEMAL ANTIBODIES, TRADITIONAL SCREENING AND DIAGNOSIS ALGORITHM: RPR Ser Ql: NONREACTIVE

## 2024-10-15 LAB — GC/CHLAMYDIA PROBE AMP (~~LOC~~) NOT AT ARMC
Chlamydia: NEGATIVE
Comment: NEGATIVE
Comment: NORMAL
Neisseria Gonorrhea: NEGATIVE

## 2024-12-11 ENCOUNTER — Emergency Department (HOSPITAL_BASED_OUTPATIENT_CLINIC_OR_DEPARTMENT_OTHER)
Admission: EM | Admit: 2024-12-11 | Discharge: 2024-12-11 | Disposition: A | Discharge status: Home / Self Care | Payer: Self-pay

## 2024-12-11 ENCOUNTER — Encounter (HOSPITAL_BASED_OUTPATIENT_CLINIC_OR_DEPARTMENT_OTHER): Payer: Self-pay

## 2024-12-11 ENCOUNTER — Other Ambulatory Visit: Payer: Self-pay

## 2024-12-11 DIAGNOSIS — Z79899 Other long term (current) drug therapy: Secondary | ICD-10-CM | POA: Insufficient documentation

## 2024-12-11 DIAGNOSIS — J069 Acute upper respiratory infection, unspecified: Secondary | ICD-10-CM | POA: Insufficient documentation

## 2024-12-11 DIAGNOSIS — I10 Essential (primary) hypertension: Secondary | ICD-10-CM | POA: Insufficient documentation

## 2024-12-11 DIAGNOSIS — E119 Type 2 diabetes mellitus without complications: Secondary | ICD-10-CM | POA: Insufficient documentation

## 2024-12-11 DIAGNOSIS — Z794 Long term (current) use of insulin: Secondary | ICD-10-CM | POA: Insufficient documentation

## 2024-12-11 LAB — RESP PANEL BY RT-PCR (RSV, FLU A&B, COVID)  RVPGX2
Influenza A by PCR: NEGATIVE
Influenza B by PCR: NEGATIVE
Resp Syncytial Virus by PCR: NEGATIVE
SARS Coronavirus 2 by RT PCR: NEGATIVE

## 2024-12-11 LAB — GROUP A STREP BY PCR: Group A Strep by PCR: NOT DETECTED

## 2024-12-11 MED ORDER — BENZONATATE 100 MG PO CAPS: 100.0000 mg | ORAL_CAPSULE | Freq: Three times a day (TID) | ORAL | 0 refills | Status: AC

## 2024-12-11 NOTE — ED Provider Notes (Signed)
 " [facility] Provider Note   CSN: [id number] Arrival date & time: 12/11/24  9068     Patient presents with: Cough   Hailey Juarez is a 59 y.o. female.   59 year old female presenting emergency department with viral URI symptoms x 2 days.  Rhinorrhea congestion generalized malaise cough sore throat.  Cough is nonproductive.  No fevers.  Does have several sick contacts at work with similar symptoms.  No nausea vomiting or abdominal pain.   Cough      Prior to Admission medications  Medication Sig Start Date End Date Taking? Authorizing Provider  lisinopril (ZESTRIL) 40 MG tablet Take 40 mg by mouth daily. 07/01/16  Yes [provider]  insulin  glargine-yfgn (SEMGLEE , YFGN,) 100 UNIT/ML injection Inject 1 mL (100 Units total) into the skin daily. 04/21/24   Lenor Hollering, MD    Allergies: Patient has no known allergies.    Review of Systems  Respiratory:  Positive for cough.     Updated Vital Signs BP (!) 172/85 (BP Location: Right Arm)   Pulse 83   Temp 97.9 F (36.6 C) (Oral)   Resp 18   SpO2 99%   Physical Exam Vitals and nursing note reviewed.  Constitutional:      General: She is not in acute distress. HENT:     Head: Normocephalic.     Nose: Congestion and rhinorrhea present.     Mouth/Throat:     Mouth: Mucous membranes are moist.     Pharynx: Posterior oropharyngeal erythema present.  Eyes:     Conjunctiva/sclera: Conjunctivae normal.  Cardiovascular:     Rate and Rhythm: Normal rate and regular rhythm.  Pulmonary:     Effort: Pulmonary effort is normal. No respiratory distress.     Breath sounds: Normal breath sounds. No wheezing, rhonchi or rales.  Abdominal:     General: There is no distension.  Musculoskeletal:        General: Normal range of motion.     Cervical back: Normal range of motion.  Skin:    General: Skin is warm.  Neurological:     General: No focal deficit present.      Mental Status: She is alert.  Psychiatric:        Mood and Affect: Mood normal.        Behavior: Behavior normal.     (all labs ordered are listed, but only abnormal results are displayed) Labs Reviewed  RESP PANEL BY RT-PCR (RSV, FLU A&B, COVID)  [id number]  GROUP A STREP BY PCR    EKG: None  Radiology: No results found.   Procedures   Medications Ordered in the ED - No data to display                                  Medical Decision Making This is a 59 year old female with history of hypertension diabetes presenting emergency department with viral URI symptoms.  She is afebrile nontachycardic is hypertensive.  History provided by patient consistent with viral etiology.  Given her symptom onset and her diabetic history, flu/COVID/RSV ordered.  However they were negative.  With her complain of sore throat, strep test was ordered.  It was also negative.  Discussed expectant management/supportive care with patient regarding a viral URI.  Requesting a work note.  Will discharge in stable condition.      Final diagnoses:  None  ED Discharge Orders     None          Neysa Caron PARAS, DO 12/11/24 1103  "

## 2024-12-11 NOTE — Discharge Instructions (Signed)
 As discussed take Tylenol  alternate with ibuprofen  for aches and pains and generalized malaise.  Return for lightheadedness, dizziness, chest pain, shortness of breath, difficulty breathing, uncontrolled nausea vomiting, severe abdominal pain, or any new or worsening symptoms that are concerning to you.  You may try the Tessalon  Perles we have prescribed you for your cough.  Please follow-up with your primary doctor soon as possible.

## 2024-12-11 NOTE — ED Triage Notes (Signed)
 Cough, congestion, body aches, sore throat for 2 days.   Someone at work sick w similar symptoms

## 2024-12-11 NOTE — ED Notes (Signed)
 Discharge instructions reviewed with patient. Patient verbalizes understanding, no further questions at this time. Medications/prescriptions and follow up information provided. No acute distress noted at time of departure.
